# Patient Record
Sex: Female | Born: 1965 | Race: Black or African American | Hispanic: No | Marital: Single | Smoking: Never smoker
Health system: Southern US, Community
[De-identification: ages and names within clinical notes are randomized; demographics above are authoritative.]

## PROBLEM LIST (undated history)

## (undated) DIAGNOSIS — J40 Bronchitis, not specified as acute or chronic: Secondary | ICD-10-CM

## (undated) DIAGNOSIS — I10 Essential (primary) hypertension: Secondary | ICD-10-CM

---

## 2013-02-28 ENCOUNTER — Encounter (HOSPITAL_COMMUNITY): Payer: Self-pay | Admitting: *Deleted

## 2013-02-28 ENCOUNTER — Emergency Department (HOSPITAL_COMMUNITY)
Admission: EM | Admit: 2013-02-28 | Discharge: 2013-02-28 | Disposition: A | Discharge status: Home / Self Care | Payer: Self-pay | Attending: Emergency Medicine | Admitting: Emergency Medicine

## 2013-02-28 DIAGNOSIS — J029 Acute pharyngitis, unspecified: Secondary | ICD-10-CM | POA: Insufficient documentation

## 2013-02-28 DIAGNOSIS — R509 Fever, unspecified: Secondary | ICD-10-CM | POA: Insufficient documentation

## 2013-02-28 DIAGNOSIS — Z8709 Personal history of other diseases of the respiratory system: Secondary | ICD-10-CM | POA: Insufficient documentation

## 2013-02-28 DIAGNOSIS — R059 Cough, unspecified: Secondary | ICD-10-CM | POA: Insufficient documentation

## 2013-02-28 DIAGNOSIS — Z79899 Other long term (current) drug therapy: Secondary | ICD-10-CM | POA: Insufficient documentation

## 2013-02-28 DIAGNOSIS — R05 Cough: Secondary | ICD-10-CM | POA: Insufficient documentation

## 2013-02-28 HISTORY — DX: Bronchitis, not specified as acute or chronic: J40

## 2013-02-28 MED ORDER — HYDROCODONE-HOMATROPINE 5-1.5 MG/5ML PO SYRP: 5.0000 mL | ORAL_SOLUTION | Freq: Four times a day (QID) | ORAL | Status: DC | PRN

## 2013-02-28 MED ORDER — PREDNISONE 50 MG PO TABS
60.0000 mg | ORAL_TABLET | Freq: Once | ORAL | Status: AC
  Administered 2013-02-28: 60 mg via ORAL
  Filled 2013-02-28: qty 1

## 2013-02-28 MED ORDER — HYDROCOD POLST-CHLORPHEN POLST 10-8 MG/5ML PO LQCR
5.0000 mL | Freq: Once | ORAL | Status: AC
  Administered 2013-02-28: 5 mL via ORAL
  Filled 2013-02-28: qty 5

## 2013-02-28 NOTE — ED Notes (Signed)
Pt reports cough & sore throat. Thinks my have been running fever yesterday.

## 2013-02-28 NOTE — ED Provider Notes (Signed)
History     CSN: [id number]  Arrival date & time 02/28/13  4132   First MD Initiated Contact with Patient 02/28/13 [phone]      Chief Complaint  Patient presents with  . Sore Throat  . Cough    Patient is a 47 y.o. female presenting with pharyngitis and cough. The history is provided by the patient.  Sore Throat This is a new problem. The current episode started 2 days ago. The problem occurs daily. The problem has been gradually worsening. Pertinent negatives include no chest pain and no shortness of breath. The symptoms are aggravated by swallowing. Nothing relieves the symptoms. Treatments tried: OTC meds. The treatment provided no relief.  Cough Cough characteristics:  Productive Sputum characteristics:  Green Severity:  Mild Onset quality:  Gradual Duration:  2 days Timing:  Intermittent Progression:  Unchanged Smoker: no   Associated symptoms: chills, fever and sore throat   Associated symptoms: no chest pain and no shortness of breath     Past Medical History  Diagnosis Date  . Bronchitis     History reviewed. No pertinent past surgical history.  No family history on file.  History  Substance Use Topics  . Smoking status: Never Smoker   . Smokeless tobacco: Not on file  . Alcohol Use: Yes     Comment: occ    OB History   Grav Para Term Preterm Abortions TAB SAB Ect Mult Living                  Review of Systems  Constitutional: Positive for fever and chills.  HENT: Positive for sore throat.   Respiratory: Positive for cough. Negative for shortness of breath.   Cardiovascular: Negative for chest pain.    Allergies  Review of patient's allergies indicates no known allergies.  Home Medications   Current Outpatient Rx  Name  Route  Sig  Dispense  Refill  . albuterol (PROVENTIL HFA;VENTOLIN HFA) 108 (90 BASE) MCG/ACT inhaler   Inhalation   Inhale 2 puffs into the lungs as needed for wheezing.         Marland Kitchen ibuprofen (ADVIL,MOTRIN) 400 MG tablet  Oral   Take 600 mg by mouth every 6 (six) hours as needed for pain.         Marland Kitchen HYDROcodone-homatropine (HYCODAN) 5-1.5 MG/5ML syrup   Oral   Take 5 mLs by mouth every 6 (six) hours as needed for cough.   120 mL   0     BP 143/93  Pulse 105  Temp(Src) 99 F (37.2 C) (Oral)  Resp 20  Ht 5\' 6"  (1.676 m)  Wt 245 lb (111.131 kg)  BMI 39.56 kg/m2  SpO2 96%  LMP 02/14/2013  Physical Exam CONSTITUTIONAL: Well developed/well nourished HEAD: Normocephalic/atraumatic EYES: EOMI/PERRL ENMT: Mucous membranes moist, nasal congestion Uvula midline but pharyngeal erythema noted with scattered vesicles.  No stridor.  Normal phonation, she is handling secretions well.  No trismus noted.   NECK: supple no meningeal signs CV: S1/S2 noted, no murmurs/rubs/gallops noted LUNGS: Lungs are clear to auscultation bilaterally, no apparent distress ABDOMEN: soft, nontender, no rebound or guarding NEURO: Pt is awake/alert, moves all extremitiesx4 EXTREMITIES: pulses normal, full ROM SKIN: warm, color normal PSYCH: no abnormalities of mood noted  ED Course  Procedures  1. Cough   2. Pharyngitis     Pt well appearing, no distress, will try for symptomatic relief with one dose of prednisone and will give cough suppressant and she was warned of  side effects with cough suppressant Stable for d/c  MDM  Nursing notes including past medical history and social history reviewed and considered in documentation         Joya Gaskins, MD 02/28/13 [phone]

## 2013-02-28 NOTE — ED Notes (Signed)
Pt alert & oriented x4, stable gait. Patient given discharge instructions, paperwork & prescription(s). Patient  instructed to stop at the registration desk to finish any additional paperwork. Patient verbalized understanding. Pt left department w/ no further questions. 

## 2013-06-13 ENCOUNTER — Emergency Department: Payer: Self-pay | Admitting: Emergency Medicine

## 2013-06-13 LAB — BASIC METABOLIC PANEL
Calcium, Total: 8.9 mg/dL (ref 8.5–10.1)
Chloride: 104 mmol/L (ref 98–107)
Glucose: 106 mg/dL — ABNORMAL HIGH (ref 65–99)
Sodium: 137 mmol/L (ref 136–145)

## 2013-06-13 LAB — CBC
HGB: 14.3 g/dL (ref 12.0–16.0)
MCHC: 34 g/dL (ref 32.0–36.0)
RBC: 4.81 10*6/uL (ref 3.80–5.20)
WBC: 6.1 10*3/uL (ref 3.6–11.0)

## 2013-12-27 ENCOUNTER — Encounter (HOSPITAL_COMMUNITY): Payer: Self-pay | Admitting: Emergency Medicine

## 2013-12-27 ENCOUNTER — Emergency Department (HOSPITAL_COMMUNITY)
Admission: EM | Admit: 2013-12-27 | Discharge: 2013-12-27 | Disposition: A | Discharge status: Home / Self Care | Payer: Self-pay | Attending: Emergency Medicine | Admitting: Emergency Medicine

## 2013-12-27 DIAGNOSIS — Z8709 Personal history of other diseases of the respiratory system: Secondary | ICD-10-CM | POA: Insufficient documentation

## 2013-12-27 DIAGNOSIS — N390 Urinary tract infection, site not specified: Secondary | ICD-10-CM | POA: Insufficient documentation

## 2013-12-27 LAB — URINALYSIS, ROUTINE W REFLEX MICROSCOPIC
Bilirubin Urine: NEGATIVE
Glucose, UA: 250 mg/dL — AB
KETONES UR: NEGATIVE mg/dL
NITRITE: POSITIVE — AB
PROTEIN: 100 mg/dL — AB
Specific Gravity, Urine: 1.01 (ref 1.005–1.030)
UROBILINOGEN UA: 4 mg/dL — AB (ref 0.0–1.0)
pH: 5 (ref 5.0–8.0)

## 2013-12-27 LAB — URINE MICROSCOPIC-ADD ON

## 2013-12-27 MED ORDER — SULFAMETHOXAZOLE-TRIMETHOPRIM 800-160 MG PO TABS: 1.0000 | ORAL_TABLET | Freq: Two times a day (BID) | ORAL | Status: AC

## 2013-12-27 MED ORDER — PHENAZOPYRIDINE HCL 200 MG PO TABS: 200.0000 mg | ORAL_TABLET | Freq: Three times a day (TID) | ORAL | Status: DC

## 2013-12-27 NOTE — ED Provider Notes (Signed)
Medical screening examination/treatment/procedure(s) were performed by non-physician practitioner and as supervising physician I was immediately available for consultation/collaboration.   EKG Interpretation None     ] Cicley Ganesh, MD, FACEP   Furqan Gosselin L Orphia Mctigue, MD 12/27/13 1608 

## 2013-12-27 NOTE — ED Notes (Signed)
Hope NP in prior to RN, see NP assessment for further,  

## 2013-12-27 NOTE — ED Notes (Signed)
uti sx x 3 days with burning, frequency, and lower abd pain.  Denies n/v/fever.

## 2013-12-27 NOTE — ED Provider Notes (Signed)
CSN: [id number]     Arrival date & time 12/27/13  [id number] History   First MD Initiated Contact with Patient 12/27/13 [phone]     Chief Complaint  Patient presents with  . Urinary Tract Infection     (Consider location/radiation/quality/duration/timing/severity/associated sxs/prior Treatment) Patient is a 48 y.o. female presenting with urinary tract infection.  Urinary Tract Infection This is a new problem. The current episode started in the past 7 days. The problem occurs constantly. The problem has been gradually worsening. Nothing aggravates the symptoms. She has tried drinking for the symptoms. The treatment provided no relief.   Julie Vaughan is a 48 y.o. female who presents to the ED with UTI symptoms. She states that 4 days ago she began having urinary urgency and pain with urination. She has continued to have the symptoms and they seem to be getting worse. She denies fever or chills, nausea or vomiting, vaginal discharge or bleeding. She is not concerned about STI's.   Past Medical History  Diagnosis Date  . Bronchitis    History reviewed. No pertinent past surgical history. No family history on file. History  Substance Use Topics  . Smoking status: Never Smoker   . Smokeless tobacco: Not on file  . Alcohol Use: Yes     Comment: occ   OB History   Grav Para Term Preterm Abortions TAB SAB Ect Mult Living                 Review of Systems Negative except as stated in HPI   Allergies  Review of patient's allergies indicates no known allergies.  Home Medications   Current Outpatient Rx  Name  Route  Sig  Dispense  Refill  . albuterol (PROVENTIL HFA;VENTOLIN HFA) 108 (90 BASE) MCG/ACT inhaler   Inhalation   Inhale 2 puffs into the lungs as needed for wheezing.         Marland Kitchen. HYDROcodone-homatropine (HYCODAN) 5-1.5 MG/5ML syrup   Oral   Take 5 mLs by mouth every 6 (six) hours as needed for cough.   120 mL   0   . ibuprofen (ADVIL,MOTRIN) 400 MG tablet   Oral   Take 600  mg by mouth every 6 (six) hours as needed for pain.          BP 154/71  Pulse 82  Temp(Src) 98.5 F (36.9 C) (Oral)  Resp 18  SpO2 100%  LMP 11/28/2013 Physical Exam  Nursing note and vitals reviewed. Constitutional: She is oriented to person, place, and time. She appears well-developed and well-nourished.  HENT:  Head: Normocephalic.  Eyes: EOM are normal.  Neck: Neck supple.  Cardiovascular: Normal rate and regular rhythm.   Pulmonary/Chest: Effort normal and breath sounds normal.  Abdominal: Soft. Bowel sounds are normal. There is tenderness in the suprapubic area. There is no CVA tenderness.  Musculoskeletal: Normal range of motion.  Neurological: She is alert and oriented to person, place, and time. No cranial nerve deficit.  Skin: Skin is warm and dry.  Psychiatric: She has a normal mood and affect. Her behavior is normal.    ED Course  Procedures Results for orders placed during the hospital encounter of 12/27/13 (from the past 24 hour(s))  URINALYSIS, ROUTINE W REFLEX MICROSCOPIC     Status: Abnormal   Collection Time    12/27/13  9:08 AM      Result Value Ref Range   Color, Urine ORANGE (*) YELLOW   APPearance CLEAR  CLEAR   Specific Gravity, Urine  1.010  1.005 - 1.030   pH 5.0  5.0 - 8.0   Glucose, UA 250 (*) NEGATIVE mg/dL   Hgb urine dipstick TRACE (*) NEGATIVE   Bilirubin Urine NEGATIVE  NEGATIVE   Ketones, ur NEGATIVE  NEGATIVE mg/dL   Protein, ur 161 (*) NEGATIVE mg/dL   Urobilinogen, UA 4.0 (*) 0.0 - 1.0 mg/dL   Nitrite POSITIVE (*) NEGATIVE   Leukocytes, UA TRACE (*) NEGATIVE  URINE MICROSCOPIC-ADD ON     Status: Abnormal   Collection Time    12/27/13  9:08 AM      Result Value Ref Range   Squamous Epithelial / LPF FEW (*) RARE   WBC, UA 0-2  <3 WBC/hpf   RBC / HPF 0-2  <3 RBC/hpf   Bacteria, UA RARE  RARE    MDM  48 y.o. female who presents to the ED with pain on urination. Will treat UTI and she will follow up with her PCP or return here as  needed. Stable for discharge without fever or signs of pyelo. Discussed with the patient and all questioned fully answered. She will call me if any problems arise.    Medication List    TAKE these medications       phenazopyridine 200 MG tablet  Commonly known as:  PYRIDIUM  Take 1 tablet (200 mg total) by mouth 3 (three) times daily.     sulfamethoxazole-trimethoprim 800-160 MG per tablet  Commonly known as:  BACTRIM DS,SEPTRA DS  Take 1 tablet by mouth 2 (two) times daily.      ASK your doctor about these medications       albuterol 108 (90 BASE) MCG/ACT inhaler  Commonly known as:  PROVENTIL HFA;VENTOLIN HFA  Inhale 2 puffs into the lungs as needed for wheezing.     HYDROcodone-homatropine 5-1.5 MG/5ML syrup  Commonly known as:  HYCODAN  Take 5 mLs by mouth every 6 (six) hours as needed for cough.     ibuprofen 400 MG tablet  Commonly known as:  ADVIL,MOTRIN  Take 600 mg by mouth every 6 (six) hours as needed for pain.            [facility] Orlene Och, [place] 12/27/13 1450

## 2014-07-17 ENCOUNTER — Emergency Department: Payer: Self-pay | Admitting: Student

## 2015-04-22 ENCOUNTER — Emergency Department (HOSPITAL_COMMUNITY)
Admission: EM | Admit: 2015-04-22 | Discharge: 2015-04-22 | Disposition: A | Discharge status: Home / Self Care | Payer: Self-pay | Attending: Emergency Medicine | Admitting: Emergency Medicine

## 2015-04-22 ENCOUNTER — Encounter (HOSPITAL_COMMUNITY): Payer: Self-pay | Admitting: Emergency Medicine

## 2015-04-22 DIAGNOSIS — S39012A Strain of muscle, fascia and tendon of lower back, initial encounter: Secondary | ICD-10-CM

## 2015-04-22 DIAGNOSIS — Y929 Unspecified place or not applicable: Secondary | ICD-10-CM | POA: Insufficient documentation

## 2015-04-22 DIAGNOSIS — Y999 Unspecified external cause status: Secondary | ICD-10-CM | POA: Insufficient documentation

## 2015-04-22 DIAGNOSIS — Z79899 Other long term (current) drug therapy: Secondary | ICD-10-CM | POA: Insufficient documentation

## 2015-04-22 DIAGNOSIS — Z8709 Personal history of other diseases of the respiratory system: Secondary | ICD-10-CM | POA: Insufficient documentation

## 2015-04-22 DIAGNOSIS — Y939 Activity, unspecified: Secondary | ICD-10-CM | POA: Insufficient documentation

## 2015-04-22 DIAGNOSIS — X58XXXA Exposure to other specified factors, initial encounter: Secondary | ICD-10-CM | POA: Insufficient documentation

## 2015-04-22 MED ORDER — DICLOFENAC SODIUM 75 MG PO TBEC: 75.0000 mg | DELAYED_RELEASE_TABLET | Freq: Two times a day (BID) | ORAL | Status: DC

## 2015-04-22 MED ORDER — METHOCARBAMOL 500 MG PO TABS: 500.0000 mg | ORAL_TABLET | Freq: Three times a day (TID) | ORAL | Status: DC

## 2015-04-22 NOTE — Discharge Instructions (Signed)

## 2015-04-22 NOTE — ED Provider Notes (Signed)
History  This chart was scribed for non-physician practitioner, Ivery Quale, PA-C,working with Gerhard Munch, MD, by Karle Plumber, ED Scribe. This patient was seen in room APFT21/APFT21 and the patient's care was started at 1:51 PM.  Chief Complaint  Patient presents with  . Back Pain   The history is provided by the patient and medical records. No language interpreter was used.    HPI Comments:  Julie Vaughan is a 49 y.o. obese female who presents to the Emergency Department complaining of severe lower back pain that started approximately one week ago. Pt reports the pain radiates to her left-side. She states she believed she had a UTI secondary to increased urination. 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 She has not taken anything for her pain. She denies numbness, tingling or weakness of the lower extremities, fever, chills, nausea, vomiting, bowel or bladder incontinence.  Past Medical History  Diagnosis Date  . Bronchitis    History reviewed. No pertinent past surgical history. No family history on file. History  Substance Use Topics  . Smoking status: Never Smoker   . Smokeless tobacco: Not on file  . Alcohol Use: Yes     Comment: occ   OB History    No data available     Review of Systems  Constitutional: Negative for fever and chills.  Gastrointestinal: Negative for nausea and vomiting.  Genitourinary: Positive for frequency.  Musculoskeletal: Positive for back pain.  Neurological: Negative for weakness and numbness.  All other systems reviewed and are negative.   Allergies  Review of patient's allergies indicates no known allergies.  Home Medications   Prior to Admission medications   Medication Sig Start Date End Date Taking? Authorizing Provider  albuterol (PROVENTIL HFA;VENTOLIN HFA) 108 (90 BASE) MCG/ACT inhaler Inhale 2 puffs into the lungs as needed for wheezing.    Historical Provider, MD   HYDROcodone-homatropine (HYCODAN) 5-1.5 MG/5ML syrup Take 5 mLs by mouth every 6 (six) hours as needed for cough. 02/28/13   Zadie Rhine, MD  ibuprofen (ADVIL,MOTRIN) 400 MG tablet Take 600 mg by mouth every 6 (six) hours as needed for pain.    Historical Provider, MD  phenazopyridine (PYRIDIUM) 200 MG tablet Take 1 tablet (200 mg total) by mouth 3 (three) times daily. 12/27/13   Hope Orlene Och, NP   Triage Vitals: BP 145/104 mmHg  Pulse 98  Temp(Src) 98.2 F (36.8 C) (Oral)  Resp 16  Ht 5\' 6"  (1.676 m)  Wt 235 lb (106.595 kg)  BMI 37.95 kg/m2  SpO2 100%  LMP 04/22/2015 Physical Exam  Constitutional: She is oriented to person, place, and time. She appears well-developed and well-nourished.  HENT:  Head: Normocephalic and atraumatic.  Eyes: EOM are normal.  Neck: Normal range of motion.  Cardiovascular: Normal rate, regular rhythm and normal heart sounds.   No murmur heard. Pulmonary/Chest: Effort normal and breath sounds normal. No respiratory distress.  Musculoskeletal: Normal range of motion.  Paraspinal area of tenderness to lumbar region with ROM on right and left.  Neurological: She is alert and oriented to person, place, and time. She exhibits normal muscle tone. Coordination and gait normal.  No gross neurologic deficits. No sensory deficits. Gait steady, no foot drop  Skin: Skin is warm and dry.  Psychiatric: She has a normal mood and affect. Her behavior is normal.  Nursing note and vitals reviewed.   ED Course  Procedures (including critical care time) DIAGNOSTIC STUDIES: Oxygen Saturation is 100% on  RA, normal by my interpretation.   COORDINATION OF CARE: 1:57 PM- Will prescribe Robaxin. Return precautions discussed. Offered work note to rest her back but pt declined. Pt verbalizes understanding and agrees to plan.  Medications - No data to display  Labs Review Labs Reviewed - No data to display  Imaging Review No results found.   EKG Interpretation None       MDM  Patient presents with one week of lower back pain on the right and on the left. His been no high fever reported. No dysuria reported. No report of blunt trauma. The patient does do repetitive lifting, and she works in a infant day care.  The examination suggests muscle strain in the lumbar region. The patient will be treated with Robaxin and diclofenac. Also asked patient to use heating pad to the area and rest her back is much as possible. The patient acknowledges this discharge instruction. She will return if any changes, problems, or concerns.    Final diagnoses:  None    **I have reviewed nursing notes, vital signs, and all appropriate lab and imaging results for this patient.*  I personally performed the services described in this documentation, which was scribed in my presence. The recorded information has been reviewed and is accurate.    Ivery Quale, PA-C 04/22/15 1418  Gerhard Munch, MD 04/22/15 [phone]

## 2015-04-22 NOTE — ED Notes (Signed)
Pt reports intermittent back x 1 weeks. Pt reports back pain in that past, pt denies any recent injuries.

## 2017-06-24 ENCOUNTER — Emergency Department
Admission: EM | Admit: 2017-06-24 | Discharge: 2017-06-24 | Disposition: A | Discharge status: Home / Self Care | Payer: 59 | Attending: Emergency Medicine | Admitting: Emergency Medicine

## 2017-06-24 ENCOUNTER — Encounter: Payer: Self-pay | Admitting: Emergency Medicine

## 2017-06-24 DIAGNOSIS — R05 Cough: Secondary | ICD-10-CM | POA: Diagnosis present

## 2017-06-24 DIAGNOSIS — Z8709 Personal history of other diseases of the respiratory system: Secondary | ICD-10-CM | POA: Insufficient documentation

## 2017-06-24 DIAGNOSIS — Z79899 Other long term (current) drug therapy: Secondary | ICD-10-CM | POA: Diagnosis not present

## 2017-06-24 DIAGNOSIS — J00 Acute nasopharyngitis [common cold]: Secondary | ICD-10-CM | POA: Insufficient documentation

## 2017-06-24 DIAGNOSIS — R059 Cough, unspecified: Secondary | ICD-10-CM

## 2017-06-24 MED ORDER — AZITHROMYCIN 250 MG PO TABS: ORAL_TABLET | ORAL | 0 refills | Status: AC

## 2017-06-24 MED ORDER — GUAIFENESIN-CODEINE 100-10 MG/5ML PO SOLN: 5.0000 mL | Freq: Three times a day (TID) | ORAL | 0 refills | Status: DC | PRN

## 2017-06-24 NOTE — ED Notes (Signed)
Pt started Sunday with nasal congestion and post nasal drip - Pt reports that she started with a cough yesterday - pt took her bosses inhaler today at work and it made her "breath better" - she reports that 2-3 years ago she was given albuterol inhaler for a similar issue - cough became productive (thick green mucus) this morning

## 2017-06-24 NOTE — ED Triage Notes (Signed)
Pt reports cough that began yesterday. Pt reports coughing up green phlegm. Pt denies any other symptoms. Pt speaking in complete sentences without difficulty. 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

## 2017-06-24 NOTE — ED Provider Notes (Signed)
[facility] Emergency Department Provider Note   ____________________________________________   I have reviewed the triage vital signs and the nursing notes.   HISTORY  Chief Complaint Cough    HPI Julie Vaughan is a 51 y.o. female presents to the emergency department with a productive cough, nasal congestion, sinus pressure, postnasal drip that developed over the last 2-3 days. Patient noted the cough significantly worsening yesterday and interfering with sleeping through the night. Patient reports getting a cold every year mostly occurring during the winter. Patient denies fever, chills, headache, vision changes, chest pain, chest tightness, shortness of breath, abdominal pain, nausea and vomiting.  Past Medical History:  Diagnosis Date  . Bronchitis     There are no active problems to display for this patient.   No past surgical history on file.  Prior to Admission medications   Medication Sig Start Date End Date Taking? Authorizing Provider  albuterol (PROVENTIL HFA;VENTOLIN HFA) 108 (90 BASE) MCG/ACT inhaler Inhale 2 puffs into the lungs as needed for wheezing.    [provider]  azithromycin (ZITHROMAX Z-PAK) 250 MG tablet Take 2 tablets (500 mg) on  Day 1,  followed by 1 tablet (250 mg) once daily on Days 2 through 5. 06/24/17 06/29/17  Mable Lashley M, PA-C  diclofenac (VOLTAREN) 75 MG EC tablet Take 1 tablet (75 mg total) by mouth 2 (two) times daily. 04/22/15   Ivery Quale, PA-C  guaiFENesin-codeine 100-10 MG/5ML syrup Take 5 mLs by mouth 3 (three) times daily as needed for cough. 06/24/17   Christain Niznik M, PA-C  HYDROcodone-homatropine (HYCODAN) 5-1.5 MG/5ML syrup Take 5 mLs by mouth every 6 (six) hours as needed for cough. 02/28/13   Zadie Rhine, MD  ibuprofen (ADVIL,MOTRIN) 400 MG tablet Take 600 mg by mouth every 6 (six) hours as needed for pain.    [provider]  methocarbamol (ROBAXIN) 500 MG tablet Take 1 tablet  (500 mg total) by mouth 3 (three) times daily. 04/22/15   Ivery Quale, PA-C  phenazopyridine (PYRIDIUM) 200 MG tablet Take 1 tablet (200 mg total) by mouth 3 (three) times daily. 12/27/13   Janne Napoleon, NP    Allergies Patient has no known allergies.  No family history on file.  Social History Social History  Substance Use Topics  . Smoking status: Never Smoker  . Smokeless tobacco: Not on file  . Alcohol use Yes     Comment: occ    Review of Systems Constitutional: Negative for fever/chills Eyes: No visual changes. ENT:  Mild sore throat without difficulty swallowing. Nasal congestion with post-nasal drip. Sinus pressure.  Cardiovascular: Denies chest pain. Respiratory: Productive cough.  Gastrointestinal: No abdominal pain.  No nausea, vomiting, diarrhea. Skin: Negative for rash. Neurological: positivefor headaches.   ____________________________________________   PHYSICAL EXAM:  VITAL SIGNS: ED Triage Vitals  Enc Vitals Group     BP 06/24/17 1600 (!) 156/94     Pulse Rate 06/24/17 1600 85     Resp 06/24/17 1600 18     Temp 06/24/17 1600 98.4 F (36.9 C)     Temp Source 06/24/17 1600 Oral     SpO2 06/24/17 1600 100 %     Weight 06/24/17 1601 230 lb (104.3 kg)     Height 06/24/17 1601  (1.676 m)     Head Circumference --      Peak Flow --      Pain Score --      Pain Loc --  Pain Edu? --      Excl. in GC? --     Constitutional: Alert and oriented. Well appearing and in no acute distress.  Eyes: Conjunctivae are normal. PERRL. EOMI  Head: Normocephalic and atraumatic. ENT: Ears:Canals clear. TMs intact bilaterally. Nose: Congestion/rhinnorhea. Mouth/Throat: Mucous membranes are moist. Oropharynx with mild erythema. Tonsils symmetrical bilaterally. Neck:Supple. No thyromegaly. No stridor. Cardiovascular: Normal rate, regular rhythm. Good peripheral circulation. Respiratory: Productive cough with yellow and green sputum noted  during coughing episodes. Normal respiratory effort without tachypnea or retractions. Lungs CTAB. Good air entry to the bases with no decreased or absent breath sounds. Cardiovascular: Normal rate, regular rhythm. Normal distal pulses. Neurologic: Normal speech and language.  Skin:  Skin is warm, dry and intact. No rash noted. Psychiatric:Mood and affect are normal. Speech and behavior are normal. Patient exhibits appropriate insight and judgement. ____________________________________________   LABS (all labs ordered are listed, but only abnormal results are displayed)  Labs Reviewed - No data to display ____________________________________________  EKG none ____________________________________________  RADIOLOGY none ____________________________________________   PROCEDURES  Procedure(s) performed: no    Critical Care performed: no ____________________________________________   INITIAL IMPRESSION / ASSESSMENT AND PLAN / ED COURSE  Pertinent labs & imaging results that were available during my care of the patient were reviewed by me and considered in my medical decision making (see chart for details).  Patient presents to the emergency department productive cough, congestion, rhinorrhea, post-nasal drip, and fatigue. History and physical exam are reassuring symptoms are consistent with nasopharyngitis with cough. Patient will be prescribed azithromycin for antibiotic coverage and guaifenesin with codeine. Recommend patient to utilize OTC cold and sinus for symptom management as needed. Physical exam and vital signes were reassuring at this time. Patient informed of clinical course, understand medical decision-making process, and agree with plan. Patient was advised to follow up with PCP as needed and was also advised to return to the emergency department for symptoms that change or worsen.  ____________________________________________   FINAL CLINICAL IMPRESSION(S) / ED  DIAGNOSES  Final diagnoses:  Acute nasopharyngitis  Cough       NEW MEDICATIONS STARTED DURING THIS VISIT:  Discharge Medication List as of 06/24/2017  4:49 PM    START taking these medications   Details  azithromycin (ZITHROMAX Z-PAK) 250 MG tablet Take 2 tablets (500 mg) on  Day 1,  followed by 1 tablet (250 mg) once daily on Days 2 through 5., Print    guaiFENesin-codeine 100-10 MG/5ML syrup Take 5 mLs by mouth 3 (three) times daily as needed for cough., Starting Wed 06/24/2017, Print         Note:  This document was prepared using Dragon voice recognition software and may include unintentional dictation errors.    Clois Comber, PA-C 06/24/17 1722    Emily Filbert, MD 06/25/17 [phone]

## 2017-06-24 NOTE — Discharge Instructions (Signed)
Take medication as prescribed. Return to emergency department if symptoms worsen and follow-up with PCP as needed.   °

## 2017-08-24 ENCOUNTER — Emergency Department
Admission: EM | Admit: 2017-08-24 | Discharge: 2017-08-24 | Disposition: A | Discharge status: Home / Self Care | Payer: 59 | Attending: Emergency Medicine | Admitting: Emergency Medicine

## 2017-08-24 ENCOUNTER — Other Ambulatory Visit: Payer: Self-pay

## 2017-08-24 ENCOUNTER — Encounter: Payer: Self-pay | Admitting: Emergency Medicine

## 2017-08-24 DIAGNOSIS — S39012A Strain of muscle, fascia and tendon of lower back, initial encounter: Secondary | ICD-10-CM | POA: Insufficient documentation

## 2017-08-24 DIAGNOSIS — Y9389 Activity, other specified: Secondary | ICD-10-CM | POA: Insufficient documentation

## 2017-08-24 DIAGNOSIS — X500XXA Overexertion from strenuous movement or load, initial encounter: Secondary | ICD-10-CM | POA: Insufficient documentation

## 2017-08-24 DIAGNOSIS — Y92003 Bedroom of unspecified non-institutional (private) residence as the place of occurrence of the external cause: Secondary | ICD-10-CM | POA: Insufficient documentation

## 2017-08-24 DIAGNOSIS — Z79899 Other long term (current) drug therapy: Secondary | ICD-10-CM | POA: Insufficient documentation

## 2017-08-24 DIAGNOSIS — Y999 Unspecified external cause status: Secondary | ICD-10-CM | POA: Diagnosis not present

## 2017-08-24 DIAGNOSIS — S3992XA Unspecified injury of lower back, initial encounter: Secondary | ICD-10-CM | POA: Diagnosis present

## 2017-08-24 LAB — URINALYSIS, COMPLETE (UACMP) WITH MICROSCOPIC
BACTERIA UA: NONE SEEN
BILIRUBIN URINE: NEGATIVE
Glucose, UA: NEGATIVE mg/dL
KETONES UR: NEGATIVE mg/dL
LEUKOCYTES UA: NEGATIVE
Nitrite: NEGATIVE
Protein, ur: NEGATIVE mg/dL
SPECIFIC GRAVITY, URINE: 1.025 (ref 1.005–1.030)
pH: 5 (ref 5.0–8.0)

## 2017-08-24 MED ORDER — MELOXICAM 15 MG PO TABS: 15.0000 mg | ORAL_TABLET | Freq: Every day | ORAL | 2 refills | Status: AC

## 2017-08-24 MED ORDER — BACLOFEN 10 MG PO TABS: 10.0000 mg | ORAL_TABLET | Freq: Every day | ORAL | 1 refills | Status: AC

## 2017-08-24 NOTE — ED Notes (Signed)
Left flank pain that increases with certain movmetn feels like a catch.  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

## 2017-08-24 NOTE — ED Provider Notes (Signed)
[facility] Emergency Department Provider Note  ____________________________________________   First MD Initiated Contact with Patient 08/24/17 1209     (approximate)  I have reviewed the triage vital signs and the nursing notes.   HISTORY  Chief Complaint Back Pain    HPI Julie Vaughan is a 51 y.o. female lanes of low back pain, states she was carrying a tote to her bedroom and twisted, states she felt a catch in her back, denies numbness or tingling, states it felt a little better last night but was worse this morning, states she is worried about a UTI because she has urinary frequency, denies fever or chills, denies vaginal discharge   Past Medical History:  Diagnosis Date  . Bronchitis     There are no active problems to display for this patient.   History reviewed. No pertinent surgical history.  Prior to Admission medications   Medication Sig Start Date End Date Taking? Authorizing Provider  albuterol (PROVENTIL HFA;VENTOLIN HFA) 108 (90 BASE) MCG/ACT inhaler Inhale 2 puffs into the lungs as needed for wheezing.    [provider]  baclofen (LIORESAL) 10 MG tablet Take 1 tablet (10 mg total) by mouth daily. 08/24/17 08/24/18  Jenni Thew, Roselyn BeringSusan W, PA-C  meloxicam (MOBIC) 15 MG tablet Take 1 tablet (15 mg total) by mouth daily. 08/24/17 08/24/18  Faythe GheeFisher, Carren Blakley W, PA-C    Allergies Patient has no known allergies.  History reviewed. No pertinent family history.  Social History Social History   Tobacco Use  . Smoking status: Never Smoker  . Smokeless tobacco: Never Used  Substance Use Topics  . Alcohol use: Yes    Comment: occ  . Drug use: No    Review of Systems  Constitutional: No fever/chills Eyes: No visual changes. ENT: No sore throat. Respiratory: Denies cough Genitourinary: Neg for dysuria. Positive for frequency Musculoskeletal: Positive for back pain. Skin: Negative for  rash.    ____________________________________________   PHYSICAL EXAM:  VITAL SIGNS: ED Triage Vitals [08/24/17 1134]  Enc Vitals Group     BP (!) 152/94     Pulse Rate 80     Resp 18     Temp 98.7 F (37.1 C)     Temp Source Oral     SpO2 100 %     Weight 230 lb (104.3 kg)     Height      Head Circumference      Peak Flow      Pain Score 10     Pain Loc      Pain Edu?      Excl. in GC?     Constitutional: Alert and oriented. Well appearing and in no acute distress. Eyes: Conjunctivae are normal.  Head: Atraumatic. Nose: No congestion/rhinnorhea. Mouth/Throat: Mucous membranes are moist.   Cardiovascular: Normal rate, regular rhythm. Respiratory: Normal respiratory effort.  No retractions GU: deferred Musculoskeletal: FROM all extremities, warm and well perfused, lumbar spine is tender in the paravertebral muscles and at the right side SI joint, patient is able to ambulate without difficulty,  negative straight leg raise Neurologic:  Normal speech and language.  Skin:  Skin is warm, dry and intact. No rash noted. Psychiatric: Mood and affect are normal. Speech and behavior are normal.  ____________________________________________   LABS (all labs ordered are listed, but only abnormal results are displayed)  Labs Reviewed  URINALYSIS, COMPLETE (UACMP) WITH MICROSCOPIC - Abnormal; Notable for the following components:      Result Value  Color, Urine YELLOW (*)    APPearance CLEAR (*)    Hgb urine dipstick MODERATE (*)    Squamous Epithelial / LPF 0-5 (*)    All other components within normal limits   ____________________________________________   ____________________________________________  RADIOLOGY    ____________________________________________   PROCEDURES  Procedure(s) performed: No      ____________________________________________   INITIAL IMPRESSION / ASSESSMENT AND PLAN / ED COURSE  Pertinent labs & imaging results that were  available during my care of the patient were reviewed by me and considered in my medical decision making (see chart for details).  Patient is a 51 year old female, she is complaining of low back pain and questions if it's UTI, urinalysis is negative, diagnosis is lumbar strain, prescription for meloxicam 15 mg daily and baclofen 10 mg 3 times a day was given, patient was instructed on how to do stretches and core strengthening exercises, she is to use wet heat followed by ice, she is follow-up with her regular doctor or the acute care if not better in 5-7 days, she was cautioned on the use of the muscle relaxer while driving      ____________________________________________   FINAL CLINICAL IMPRESSION(S) / ED DIAGNOSES  Final diagnoses:  Strain of lumbar region, initial encounter      NEW MEDICATIONS STARTED DURING THIS VISIT:  This SmartLink is deprecated. Use AVSMEDLIST instead to display the medication list for a patient.   Note:  This document was prepared using Dragon voice recognition software and may include unintentional dictation errors.    Faythe GheeFisher, Tyreanna Bisesi W, PA-C 08/24/17 1312    Jene EveryKinner, Robert, MD 08/24/17 [phone]

## 2017-08-24 NOTE — ED Triage Notes (Signed)
Pt to ed with c/o frequency of urine and left sided back pain started last night and today.  Reports it feels like UTI.  She reports she has had several in the past.  Denies burning with urination. Denies recent injury.

## 2017-08-24 NOTE — Discharge Instructions (Signed)
Follow-up with the regular doctor if not better in 5-7 days, use medication as prescribed, use wet heat followed by ice, use the stretches that were described for you, if you're worsening return to the emergency department, your urinalysis was negative

## 2018-02-18 ENCOUNTER — Emergency Department: Payer: 59

## 2018-02-18 ENCOUNTER — Other Ambulatory Visit: Payer: Self-pay

## 2018-02-18 ENCOUNTER — Emergency Department
Admission: EM | Admit: 2018-02-18 | Discharge: 2018-02-18 | Disposition: A | Discharge status: Home / Self Care | Payer: 59 | Attending: Emergency Medicine | Admitting: Emergency Medicine

## 2018-02-18 DIAGNOSIS — Z79899 Other long term (current) drug therapy: Secondary | ICD-10-CM | POA: Diagnosis not present

## 2018-02-18 DIAGNOSIS — M25571 Pain in right ankle and joints of right foot: Secondary | ICD-10-CM

## 2018-02-18 MED ORDER — NAPROXEN 500 MG PO TABS
500.0000 mg | ORAL_TABLET | Freq: Once | ORAL | Status: AC
  Administered 2018-02-18: 500 mg via ORAL
  Filled 2018-02-18: qty 1

## 2018-02-18 MED ORDER — NAPROXEN 500 MG PO TABS: 500.0000 mg | ORAL_TABLET | Freq: Two times a day (BID) | ORAL | Status: DC

## 2018-02-18 NOTE — ED Provider Notes (Signed)
[facility] Emergency Department Provider Note   ____________________________________________   First MD Initiated Contact with Patient 02/18/18 0957     (approximate)  I have reviewed the triage vital signs and the nursing notes.   HISTORY  Chief Complaint Ankle Pain    HPI Julie Vaughan is a 52 y.o. female patient complain of right ankle pain for 2 days.  Patient had a previous injury back in March 2019 which improved with elastic support.  Patient state pain increased today with no other provocative incident.  Patient rates pain as a 7/10.  Patient described the pain is "throbbing".  No palliative measures prior to arrival.   Past Medical History:  Diagnosis Date  . Bronchitis     There are no active problems to display for this patient.   History reviewed. No pertinent surgical history.  Prior to Admission medications   Medication Sig Start Date End Date Taking? Authorizing Provider  albuterol (PROVENTIL HFA;VENTOLIN HFA) 108 (90 BASE) MCG/ACT inhaler Inhale 2 puffs into the lungs as needed for wheezing.    [provider]  baclofen (LIORESAL) 10 MG tablet Take 1 tablet (10 mg total) by mouth daily. 08/24/17 08/24/18  Fisher, Roselyn Bering, PA-C  meloxicam (MOBIC) 15 MG tablet Take 1 tablet (15 mg total) by mouth daily. 08/24/17 08/24/18  Sherrie Mustache Roselyn Bering, PA-C  naproxen (NAPROSYN) 500 MG tablet Take 1 tablet (500 mg total) by mouth 2 (two) times daily with a meal. 02/18/18   Joni Reining, PA-C    Allergies Patient has no known allergies.  No family history on file.  Social History Social History   Tobacco Use  . Smoking status: Never Smoker  . Smokeless tobacco: Never Used  Substance Use Topics  . Alcohol use: Yes    Comment: occ  . Drug use: No    Review of Systems Constitutional: No fever/chills Eyes: No visual changes. ENT: No sore throat. Cardiovascular: Denies chest pain. Respiratory: Denies shortness of  breath. Gastrointestinal: No abdominal pain.  No nausea, no vomiting.  No diarrhea.  No constipation. Genitourinary: Negative for dysuria. Musculoskeletal: Right ankle pain.. Skin: Negative for rash. Neurological: Negative for headaches, focal weakness or numbness.   ____________________________________________   PHYSICAL EXAM:  VITAL SIGNS: ED Triage Vitals  Enc Vitals Group     BP 02/18/18 0924 136/79     Pulse Rate 02/18/18 0924 81     Resp 02/18/18 0924 16     Temp 02/18/18 0924 (!) 97.4 F (36.3 C)     Temp Source 02/18/18 0924 Oral     SpO2 02/18/18 0924 100 %     Weight 02/18/18 0925 220 lb (99.8 kg)     Height 02/18/18 0925  (1.676 m)     Head Circumference --      Peak Flow --      Pain Score --      Pain Loc --      Pain Edu? --      Excl. in GC? --    Constitutional: Alert and oriented. Well appearing and in no acute distress. Cardiovascular: Normal rate, regular rhythm. Grossly normal heart sounds.  Good peripheral circulation. Respiratory: Normal respiratory effort.  No retractions. Lungs CTAB. Musculoskeletal: No obvious deformity to the right ankle.  There is mild edema to the medial aspect of the malleolus. Neurologic:  Normal speech and language. No gross focal neurologic deficits are appreciated. No gait instability. Skin:  Skin is warm, dry and intact.  No rash noted. Psychiatric: Mood and affect are normal. Speech and behavior are normal.  ____________________________________________   LABS (all labs ordered are listed, but only abnormal results are displayed)  Labs Reviewed - No data to display ____________________________________________  EKG   ____________________________________________  RADIOLOGY  ED MD interpretation:    Official radiology report(s): Dg Ankle Complete Right  Result Date: 02/18/2018 CLINICAL DATA:  Swelling, pain of right ankle.  Remote injury. EXAM: RIGHT ANKLE - COMPLETE 3+ VIEW COMPARISON:  None. FINDINGS:  Mild diffuse soft tissue swelling. Well corticated bone fragment adjacent to the tip of the medial malleolus. This likely related to old injury. No acute fracture, subluxation or dislocation. IMPRESSION: No acute bony abnormality. Electronically Signed   By: Charlett Nose M.D.   On: 02/18/2018 09:49    ____________________________________________   PROCEDURES  Procedure(s) performed: None  Procedures  Critical Care performed: No  ____________________________________________   INITIAL IMPRESSION / ASSESSMENT AND PLAN / ED COURSE  As part of my medical decision making, I reviewed the following data within the electronic MEDICAL RECORD NUMBER    Right ankle pain secondary to previous injury of the medial malleolus.  Discussed x-ray findings with patient.  Patient given discharge care instruction.  Patient placed in a ankle splint and postop shoe.  Patient advised follow-up with orthopedic for definitive evaluation and treatment.      ____________________________________________   FINAL CLINICAL IMPRESSION(S) / ED DIAGNOSES  Final diagnoses:  Acute right ankle pain     ED Discharge Orders        Ordered    naproxen (NAPROSYN) 500 MG tablet  2 times daily with meals     02/18/18 1007       Note:  This document was prepared using Dragon voice recognition software and may include unintentional dictation errors.    Joni Reining, PA-C 02/18/18 1014    Sharman Cheek, MD 02/18/18 [phone]

## 2018-02-18 NOTE — ED Triage Notes (Signed)
Pt reports that she is having pain in her right ankle x2 days - the pain started in March and she got ankle wrap and the pain improved until 2 days ago

## 2018-02-18 NOTE — ED Notes (Signed)
See triage note  Presents with swelling and pain to right ankle  States she had injury to same ankle in March  Developed this pain 2 days ago  Ambulates with slight limp to room

## 2018-02-18 NOTE — Discharge Instructions (Signed)
Wear ankle splint and open shoe until evaluation by orthopedic.  Call to schedule appointment.

## 2019-04-27 ENCOUNTER — Other Ambulatory Visit: Payer: Self-pay

## 2019-04-27 DIAGNOSIS — Z20822 Contact with and (suspected) exposure to covid-19: Secondary | ICD-10-CM

## 2019-04-28 LAB — NOVEL CORONAVIRUS, NAA: SARS-CoV-2, NAA: DETECTED — AB

## 2020-07-13 ENCOUNTER — Emergency Department (HOSPITAL_COMMUNITY)
Admission: EM | Admit: 2020-07-13 | Discharge: 2020-07-13 | Disposition: A | Discharge status: Home / Self Care | Payer: BC Managed Care – PPO | Attending: Emergency Medicine | Admitting: Emergency Medicine

## 2020-07-13 ENCOUNTER — Emergency Department (HOSPITAL_COMMUNITY): Payer: BC Managed Care – PPO

## 2020-07-13 ENCOUNTER — Other Ambulatory Visit: Payer: Self-pay

## 2020-07-13 ENCOUNTER — Encounter (HOSPITAL_COMMUNITY): Payer: Self-pay | Admitting: Emergency Medicine

## 2020-07-13 DIAGNOSIS — R519 Headache, unspecified: Secondary | ICD-10-CM | POA: Diagnosis not present

## 2020-07-13 DIAGNOSIS — Y9389 Activity, other specified: Secondary | ICD-10-CM | POA: Diagnosis not present

## 2020-07-13 DIAGNOSIS — M542 Cervicalgia: Secondary | ICD-10-CM | POA: Diagnosis not present

## 2020-07-13 DIAGNOSIS — Y92410 Unspecified street and highway as the place of occurrence of the external cause: Secondary | ICD-10-CM | POA: Insufficient documentation

## 2020-07-13 MED ORDER — NAPROXEN 250 MG PO TABS
500.0000 mg | ORAL_TABLET | Freq: Once | ORAL | Status: AC
  Administered 2020-07-13: 500 mg via ORAL
  Filled 2020-07-13: qty 2

## 2020-07-13 MED ORDER — METHOCARBAMOL 500 MG PO TABS: 500.0000 mg | ORAL_TABLET | Freq: Two times a day (BID) | ORAL | 0 refills | Status: DC
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
## 2020-07-13 NOTE — Discharge Instructions (Signed)
The pain you are experiencing is likely due to muscle strain, you may take Ibuprofen or Naprosyn and Robaxin as needed for pain management. Do not combine with any pain reliever other than tylenol.  You may also use ice and heat, and over-the-counter remedies such as Biofreeze gel or salon pas lidocaine patches. The muscle soreness should improve over the next week. Follow up with your family doctor in the next week for a recheck if you are still having symptoms. Return to ED if pain is worsening, you develop weakness or numbness of extremities, or new or concerning symptoms develop. ° °
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
## 2020-07-13 NOTE — ED Notes (Signed)
Pt reports she was seen last week at urgent care for HTN  Suggested that she follow w her PCP for HTN which she has yet to do

## 2021-12-17 ENCOUNTER — Emergency Department (HOSPITAL_COMMUNITY): Payer: 59

## 2021-12-17 ENCOUNTER — Other Ambulatory Visit: Payer: Self-pay

## 2021-12-17 ENCOUNTER — Emergency Department (HOSPITAL_COMMUNITY)
Admission: EM | Admit: 2021-12-17 | Discharge: 2021-12-17 | Disposition: A | Discharge status: Home / Self Care | Payer: 59 | Attending: Emergency Medicine | Admitting: Emergency Medicine

## 2021-12-17 ENCOUNTER — Encounter (HOSPITAL_COMMUNITY): Payer: Self-pay | Admitting: *Deleted

## 2021-12-17 DIAGNOSIS — G8929 Other chronic pain: Secondary | ICD-10-CM | POA: Diagnosis not present

## 2021-12-17 DIAGNOSIS — M25562 Pain in left knee: Secondary | ICD-10-CM | POA: Insufficient documentation

## 2021-12-17 NOTE — ED Triage Notes (Signed)
Left knee pain for couple of months off and on. Denies any injury.  ?

## 2021-12-17 NOTE — ED Provider Notes (Signed)
?[facility] EMERGENCY DEPARTMENT ?Provider Note ? ? ?CSN: [id number] ?Arrival date & time: 12/17/21  1248 ? ?  ? ?History ? ?Chief Complaint  ?Patient presents with  ? Knee Pain  ? ? ?Julie Vaughan is a 56 y.o. female. ? ?HPI ?Patient is a 56 year old female who presents to the emergency department due to atraumatic left knee pain.  States her symptoms started initially about 1 month ago and then began worsening 1 week ago.  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  States that her symptoms are typically worse at the end of the day.  Denies any numbness or weakness.  Denies any falls or trauma.  States that she soaked in Epsom salts this morning and took a 500 mg Tylenol with mild improvement in her symptoms. ?  ? ?Home Medications ?Prior to Admission medications   ?Medication Sig Start Date End Date Taking? Authorizing Provider  ?albuterol (PROVENTIL HFA;VENTOLIN HFA) 108 (90 BASE) MCG/ACT inhaler Inhale 2 puffs into the lungs as needed for wheezing.    [provider]  ?methocarbamol (ROBAXIN) 500 MG tablet Take 1 tablet (500 mg total) by mouth 2 (two) times daily. 07/13/20   Dartha Lodge, PA-C  ?naproxen (NAPROSYN) 500 MG tablet Take 1 tablet (500 mg total) by mouth 2 (two) times daily with a meal. 02/18/18   Joni Reining, PA-C  ?   ? ?Allergies    ?Patient has no known allergies.   ? ?Review of Systems   ?Review of Systems  ?Musculoskeletal:  Positive for arthralgias, joint swelling and myalgias.  ?Skin:  Negative for wound.  ?Neurological:  Negative for weakness and numbness.  ? ?Physical Exam ?Updated Vital Signs ?BP 139/86 (BP Location: Right Arm)   Pulse 98   Temp 97.7 ?F (36.5 ?C) (Oral)   Resp 18   Ht 5\' 6"  (1.676 m)   Wt 111.1 kg   SpO2 100%   BMI 39.54 kg/m?  ?Physical Exam ?Vitals and nursing note reviewed.  ?Constitutional:   ?   General: She is not in acute distress. ?   Appearance: She is well-developed.  ?HENT:   ?   Head: Normocephalic and atraumatic.  ?   Right Ear: External ear normal.  ?   Left Ear: External ear normal.  ?Eyes:  ?   General: No scleral icterus.    ?   Right eye: No discharge.     ?   Left eye: No discharge.  ?   Conjunctiva/sclera: Conjunctivae normal.  ?Neck:  ?   Trachea: No tracheal deviation.  ?Cardiovascular:  ?   Rate and Rhythm: Normal rate.  ?Pulmonary:  ?   Effort: Pulmonary effort is normal. No respiratory distress.  ?   Breath sounds: No stridor.  ?Abdominal:  ?   General: There is no distension.  ?Musculoskeletal:     ?   General: Tenderness present. No swelling or deformity.  ?   Cervical back: Neck supple.  ?   Comments: Mild TTP noted overlying the left patella as well as the medial and lateral joint lines.  Full active and passive range of motion of the joint with mild pain.  No overlying skin changes or soft tissue swelling noted.  Distal sensation intact.  2+ pedal pulses.  Strength is 5/5 in the bilateral lower extremities.  ?Skin: ?   General: Skin is warm and dry.  ?   Findings: No  rash.  ?Neurological:  ?   General: No focal deficit present.  ?   Mental Status: She is alert and oriented to person, place, and time.  ?   Cranial Nerves: Cranial nerve deficit: no gross deficits.  ? ?ED Results / Procedures / Treatments   ?Labs ?(all labs ordered are listed, but only abnormal results are displayed) ?Labs Reviewed - No data to display ? ?EKG ?None ? ?Radiology ?DG Knee Complete 4 Views Left ? ?Result Date: 12/17/2021 ?CLINICAL DATA:  Knee pain over the last month. EXAM: LEFT KNEE - COMPLETE 4+ VIEW COMPARISON:  None. FINDINGS: Tiny joint effusion. Medial compartment and patellofemoral compartment osteoarthritis with marginal osteophytes. No other focal finding. IMPRESSION: Tiny knee joint effusion. Medial compartment and patellofemoral compartment osteoarthritis. Electronically Signed   By: Paulina Fusi M.D.   On: 12/17/2021 13:48   ? ?Procedures ?Procedures  ? ?Medications Ordered in  ED ?Medications - No data to display ? ?ED Course/ Medical Decision Making/ A&P ?  ?                        ?Medical Decision Making ?Amount and/or Complexity of Data Reviewed ?Radiology: ordered. ? ?Pt is a 56 y.o. female who presents to the emergency department due to atraumatic left knee pain for the past month. ? ?Imaging: ?X-ray of the left knee shows a tiny knee joint effusion.  Medial compartment and patellofemoral compartment osteoarthritis. ? ?I, Placido Sou, PA-C, personally reviewed and evaluated these images and lab results as part of my medical decision-making. ? ?On my exam patient has mild tenderness diffusely along the medial and lateral joint line of the left knee as well as overlying the patella.  Full active and passive range of motion of the joint.  No soft tissue swelling or overlying skin changes.  No increased warmth in the joint.  No systemic symptoms such as fevers, chills, nausea, or vomiting.  Patient currently afebrile, nontachycardic, and nontoxic-appearing.  Doubt septic joint or gout at this time. ? 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  Her pain is typically worse at the end of the day.  History and exam findings are consistent with osteoarthritis.  I obtained an x-ray with findings as noted above. ? ?We will place patient in a knee sleeve for comfort.  Recommended continued use of Tylenol as well as Motrin.  Patient given a referral to orthopedics.  We discussed return precautions.  Her questions were answered and she was amicable at the time of discharge. ? ?Note: Portions of this report may have been transcribed using voice recognition software. Every effort was made to ensure accuracy; however, inadvertent computerized transcription errors may be present.  ? ?Final Clinical Impression(s) / ED Diagnoses ?Final diagnoses:  ?Chronic pain of left knee  ? ?Rx / DC Orders ?ED  Discharge Orders   ? ? None  ? ?  ? ? ?  ?Placido Sou, PA-C ?12/17/21 1406 ? ?  ?Benjiman Core, MD ?12/17/21 1522 ? ?

## 2021-12-17 NOTE — Discharge Instructions (Addendum)
Like we discussed, please continue to wear your knee sleeve as needed for comfort.  You can take Tylenol as well as Motrin for management of your symptoms.  Please follow instructions on the bottles. ? ?Below is the contact information for Dr. Aline Brochure.  He is a Patent examiner.  Please give them a call and schedule an appointment for reevaluation.  If you develop any new or worsening symptoms please come back to the emergency department. ?

## 2022-02-02 ENCOUNTER — Encounter: Payer: Self-pay | Admitting: Emergency Medicine

## 2022-02-02 ENCOUNTER — Ambulatory Visit
Admission: EM | Admit: 2022-02-02 | Discharge: 2022-02-02 | Disposition: A | Discharge status: Home / Self Care | Payer: 59 | Attending: Internal Medicine | Admitting: Internal Medicine

## 2022-02-02 DIAGNOSIS — J029 Acute pharyngitis, unspecified: Secondary | ICD-10-CM | POA: Insufficient documentation

## 2022-02-02 HISTORY — DX: Essential (primary) hypertension: I10

## 2022-02-02 LAB — POCT RAPID STREP A (OFFICE): Rapid Strep A Screen: NEGATIVE

## 2022-02-02 NOTE — Discharge Instructions (Addendum)
Increase oral fluid intake ?Tylenol/Motrin as needed for pain ?Strep test is negative ?We will call you with recommendations if strep cultures are abnormal ?Return to urgent care if you have any other concerns. ?

## 2022-02-02 NOTE — ED Provider Notes (Signed)
?[facility] ? ? ? ?CSN: [id number] ?Arrival date & time: 02/02/22  1302 ? ? ?  ? ?History   ?Chief Complaint ?No chief complaint on file. ? ? ?HPI ?Julie Vaughan is a 56 y.o. female comes to the urgent care with 3-day history of headaches, generalized body aches, sore throat, subjective fever and chills.  All the symptoms except sore throat have improved.  Patient continues to have sore throat with some difficulty swallowing.  No febrile episodes.  No nausea or vomiting.  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  Patient denies any shortness of breath, wheezing, chest pain or chest pressure.  No nausea, vomiting or diarrhea.  No dizziness, near syncope or syncopal episodes..  ? ?HPI ? ?Past Medical History:  ?Diagnosis Date  ? Bronchitis   ? Hypertension   ? ? ?There are no problems to display for this patient. ? ? ?History reviewed. No pertinent surgical history. ? ?OB History   ?No obstetric history on file. ?  ? ? ? ?Home Medications   ? ?Prior to Admission medications   ?Not on File  ? ? ?Family History ?History reviewed. No pertinent family history. ? ?Social History ?Social History  ? ?Tobacco Use  ? Smoking status: Never  ? Smokeless tobacco: Never  ?Substance Use Topics  ? Alcohol use: Yes  ?  Comment: occ  ? Drug use: No  ? ? ? ?Allergies   ?Patient has no known allergies. ? ? ?Review of Systems ?Review of Systems ?As per HPI ? ?Physical Exam ?Triage Vital Signs ?ED Triage Vitals  ?Enc Vitals Group  ?   BP 02/02/22 1418 115/85  ?   Pulse Rate 02/02/22 1418 (!) 112  ?   Resp 02/02/22 1418 18  ?   Temp 02/02/22 1418 99.2 ?F (37.3 ?C)  ?   Temp Source 02/02/22 1418 Oral  ?   SpO2 02/02/22 1418 94 %  ?   Weight --   ?   Height --   ?   Head Circumference --   ?   Peak Flow --   ?   Pain Score 02/02/22 1419 10  ?   Pain Loc --   ?   Pain Edu? --   ?   Excl. in [place]? --   ? ?No data found. ? ?Updated Vital Signs ?BP 115/85 (BP Location: Right Arm)    Pulse (!) 112   Temp 99.2 ?F (37.3 ?C) (Oral)   Resp 18   SpO2 94%  ? ?Visual Acuity ?Right Eye Distance:   ?Left Eye Distance:   ?Bilateral Distance:   ? ?Right Eye Near:   ?Left Eye Near:    ?Bilateral Near:    ? ?Physical Exam ?Vitals and nursing note reviewed.  ?Constitutional:   ?   General: She is not in acute distress. ?   Appearance: She is not ill-appearing.  ?HENT:  ?   Right Ear: Tympanic membrane normal.  ?   Left Ear: Tympanic membrane normal.  ?   Mouth/Throat:  ?   Mouth: Mucous membranes are moist.  ?   Pharynx: Posterior oropharyngeal erythema present.  ?Cardiovascular:  ?   Rate and Rhythm: Normal rate and regular rhythm.  ?Pulmonary:  ?   Effort: Pulmonary effort is normal.  ?   Breath sounds: Normal breath sounds.  ?Musculoskeletal:     ?   General: Swelling and tenderness present.  ?Neurological:  ?  Mental Status: She is alert.  ? ? ? ?UC Treatments / Results  ?Labs ?(all labs ordered are listed, but only abnormal results are displayed) ?Labs Reviewed  ?CULTURE, GROUP A STREP [facility])  ?POCT RAPID STREP A (OFFICE)  ? ? ?EKG ? ? ?Radiology ?No results found. ? ?Procedures ?Procedures (including critical care time) ? ?Medications Ordered in UC ?Medications - No data to display ? ?Initial Impression / Assessment and Plan / UC Course  ?I have reviewed the triage vital signs and the nursing notes. ? ?Pertinent labs & imaging results that were available during my care of the patient were reviewed by me and considered in my medical decision making (see chart for details). ? ?  ? ?1.  Acute viral illness: ?Point-of-care strep is negative ?Strep cultures have been sent ?Increase oral fluid intake ?Tylenol/Motrin as needed for pain and/or fever ?Return to urgent care if symptoms worsen. ?Final Clinical Impressions(s) / UC Diagnoses  ? ?Final diagnoses:  ?Acute viral pharyngitis  ? ? ? ?Discharge Instructions   ? ?  ?Increase oral fluid intake ?Tylenol/Motrin as needed for pain ?Strep test is  negative ?We will call you with recommendations if strep cultures are abnormal ?Return to urgent care if you have any other concerns. ? ? ?ED Prescriptions   ?None ?  ? ?PDMP not reviewed this encounter. ?  ?Chase Picket, MD ?02/02/22 1510 ? ?

## 2022-02-02 NOTE — ED Triage Notes (Signed)
Headache, chills, sore throat since Thursday.  Sore throat started on Friday.  States other symptoms have went away. Only has sore throat now. ?

## 2022-02-06 LAB — CULTURE, GROUP A STREP (THRC)

## 2022-04-30 ENCOUNTER — Emergency Department (HOSPITAL_COMMUNITY)
Admission: EM | Admit: 2022-04-30 | Discharge: 2022-04-30 | Disposition: A | Discharge status: Home / Self Care | Payer: 59 | Attending: Emergency Medicine | Admitting: Emergency Medicine

## 2022-04-30 ENCOUNTER — Emergency Department (HOSPITAL_COMMUNITY): Payer: 59

## 2022-04-30 ENCOUNTER — Encounter (HOSPITAL_COMMUNITY): Payer: Self-pay

## 2022-04-30 DIAGNOSIS — R600 Localized edema: Secondary | ICD-10-CM | POA: Insufficient documentation

## 2022-04-30 DIAGNOSIS — N3001 Acute cystitis with hematuria: Secondary | ICD-10-CM | POA: Diagnosis not present

## 2022-04-30 DIAGNOSIS — I10 Essential (primary) hypertension: Secondary | ICD-10-CM | POA: Diagnosis not present

## 2022-04-30 DIAGNOSIS — M79672 Pain in left foot: Secondary | ICD-10-CM | POA: Insufficient documentation

## 2022-04-30 DIAGNOSIS — R300 Dysuria: Secondary | ICD-10-CM | POA: Diagnosis present

## 2022-04-30 LAB — URINALYSIS, ROUTINE W REFLEX MICROSCOPIC
Bilirubin Urine: NEGATIVE
Glucose, UA: NEGATIVE mg/dL
Ketones, ur: NEGATIVE mg/dL
Nitrite: NEGATIVE
Protein, ur: NEGATIVE mg/dL
Specific Gravity, Urine: 1.02 (ref 1.005–1.030)
pH: 5 (ref 5.0–8.0)

## 2022-04-30 MED ORDER — SULFAMETHOXAZOLE-TRIMETHOPRIM 800-160 MG PO TABS: 1.0000 | ORAL_TABLET | Freq: Two times a day (BID) | ORAL | 0 refills | Status: AC

## 2022-04-30 NOTE — ED Provider Notes (Signed)
[facility] EMERGENCY DEPARTMENT Provider Note   CSN: [id number] Arrival date & time: 04/30/22  1359     History  Chief Complaint  Patient presents with   Dysuria   Foot Pain    Left    Julie Vaughan is a 56 y.o. female with a history of hypertension, presenting for evaluation of 2 complaints.  The first being increased urinary frequency along with dysuria, she states she has a history of occasional UTIs last 1 occurring within the last 12 months, she is fairly certain she has a recurrence of this condition.  She denies vaginal complaints or risk for STDs.  She denies fevers, nausea or vomiting.  She denies back or flank pain.  The second complaint is for left dorsal foot pain and swelling.  She denies injury to the foot or ankle but is on her feet a lot with her job.  She states frequently her pain is worse by the end of her work day, but over the past couple of days it has also been fairly painful when she first gets out of bed to stand on the foot in the morning.  Pain is worse with movement, better at rest.  She has used warm Epsom salt soaks which helped some.  Given her hypertension she is reluctant to take anti-inflammatories.  She denies any radiation of pain, redness, rash, numbness.  The history is provided by the patient.       Home Medications Prior to Admission medications   Medication Sig Start Date End Date Taking? Authorizing Provider  sulfamethoxazole-trimethoprim (BACTRIM DS) 800-160 MG tablet Take 1 tablet by mouth 2 (two) times daily for 5 days. 04/30/22 05/05/22 Yes Byard Carranza, Raynelle Fanning, PA-C      Allergies    Patient has no known allergies.    Review of Systems   Review of Systems  Constitutional:  Negative for chills and fever.  Genitourinary:  Positive for dysuria and frequency.  Musculoskeletal:  Positive for arthralgias and joint swelling. Negative for myalgias.  Skin: Negative.   Neurological:  Negative for weakness and numbness.  All other systems reviewed and  are negative.   Physical Exam Updated Vital Signs BP (!) 121/90 (BP Location: Right Arm)   Pulse 77   Temp 97.9 F (36.6 C) (Oral)   Resp 16   Ht 5\' 6"  (1.676 m)   Wt 115.1 kg   LMP 03/13/2020   SpO2 100%   BMI 40.95 kg/m  Physical Exam Vitals and nursing note reviewed.  Constitutional:      Appearance: She is well-developed.  HENT:     Head: Normocephalic and atraumatic.  Eyes:     Conjunctiva/sclera: Conjunctivae normal.  Cardiovascular:     Rate and Rhythm: Normal rate and regular rhythm.     Heart sounds: Normal heart sounds.  Pulmonary:     Effort: Pulmonary effort is normal.     Breath sounds: Normal breath sounds. No wheezing.  Abdominal:     General: Bowel sounds are normal.     Palpations: Abdomen is soft.     Tenderness: There is no abdominal tenderness. There is no guarding or rebound.  Musculoskeletal:        General: Normal range of motion.     Cervical back: Normal range of motion.     Left foot: Normal range of motion and normal capillary refill. Swelling and tenderness present. No deformity. Normal pulse.     Comments: She does have some mild soft tissue edema over her  left mid foot which radiates into her left lateral malleolus region.  It is modestly tender to palpation.  There is no erythema or increased warmth.  Skin is intact.  Distal sensation is intact.  Pain is worsened with active and passive flexion and extension at the ankle.  There is no foot or ankle ligament instability.  Her Achilles tendon is intact.  Skin:    General: Skin is warm and dry.     Findings: No erythema.  Neurological:     Mental Status: She is alert.    ED Results / Procedures / Treatments   Labs (all labs ordered are listed, but only abnormal results are displayed) Labs Reviewed  URINALYSIS, ROUTINE W REFLEX MICROSCOPIC - Abnormal; Notable for the following components:      Result Value   APPearance HAZY (*)    Hgb urine dipstick SMALL (*)    Leukocytes,Ua LARGE (*)     Bacteria, UA RARE (*)    All other components within normal limits    EKG None  Radiology DG Foot Complete Left  Result Date: 04/30/2022 CLINICAL DATA:  Left foot pain.  No known injury. EXAM: LEFT FOOT - COMPLETE 3+ VIEW COMPARISON:  None Available. FINDINGS: There is no evidence of fracture or dislocation. There is no evidence of arthropathy or other focal bone abnormality. Mild dorsal soft tissue swelling. IMPRESSION: 1. No acute fracture or dislocation identified about the left foot. 2. Mild dorsal soft tissue swelling. Electronically Signed   By: Ted Mcalpine M.D.   On: 04/30/2022 14:21    Procedures Procedures    Medications Ordered in ED Medications - No data to display  ED Course/ Medical Decision Making/ A&P                           Medical Decision Making Patient with 2 complaints, dysuria, concern for simple UTI, she does not have a history of kidney stones, denies flank pain fevers or chills.  Doubt this represents pyelonephritis.  No risk for STDs.  No injury to her foot but does endorse overuse, plain film imaging is negative, exam and history suggest a probable tendinitis of the foot and ankle.  She was placed in an ASO for stability and to rest the tendons.  She was encouraged to continue her warm Epsom salt soaks, she could also add an ice pack if she has increased swelling at the end of the workday.  Discussed topical Voltaren gel.  Referral to Dr. Romeo Apple if symptoms are not improved with this treatment plan.  Amount and/or Complexity of Data Reviewed Labs: ordered. Radiology: ordered.  Risk Prescription drug management.           Final Clinical Impression(s) / ED Diagnoses Final diagnoses:  Acute cystitis with hematuria  Foot pain, left    Rx / DC Orders ED Discharge Orders          Ordered    sulfamethoxazole-trimethoprim (BACTRIM DS) 800-160 MG tablet  2 times daily        04/30/22 1610              Burgess Amor,  Cordelia Poche 05/03/22 1710    Trifan, Kermit Balo, MD 05/05/22 [phone]

## 2022-04-30 NOTE — ED Notes (Signed)
Pt attempting UA at this time. 

## 2022-04-30 NOTE — ED Triage Notes (Signed)
Pt states she has a UTI. Pt endorses frequent urination and painful urination.   Pt c/o left foot pain on top of foot. Denies injury or trauma. Worse at the end of the day.

## 2022-04-30 NOTE — Discharge Instructions (Addendum)
Take the entire course of the antibiotics prescribed.  Make sure you are drinking plenty of fluids.  Get rechecked for any fever, worse pain, vomiting as these can be signs the antibiotic is not working.  Wear the ankle/foot support and continue using warm soaks (adding ice if you have increased swelling after a day at work).  I do suspect you have a tendinitis in your foot causing your symptoms.  Plan to follow up with Dr. Romeo Apple if the brace is not improving your symptoms over the next week, or your symptoms return after trying to go without the brace.

## 2022-10-22 IMAGING — DX DG KNEE COMPLETE 4+V*L*
4 series · 4 of 4 positions shown · non-contrast
Comparison: None.

CLINICAL DATA: Knee pain over the last month.

EXAM:
LEFT KNEE - COMPLETE 4+ VIEW

[knee ap]
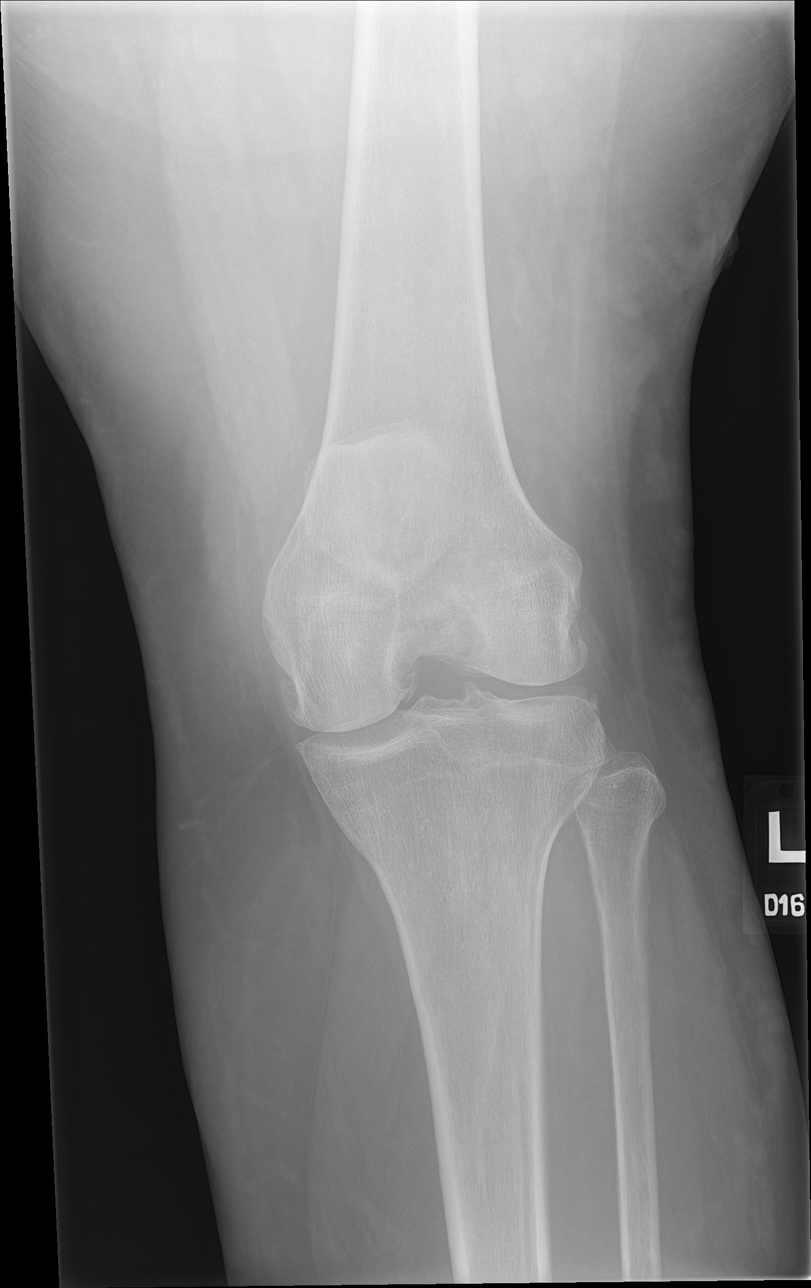

[knee obl (1 of 2)]
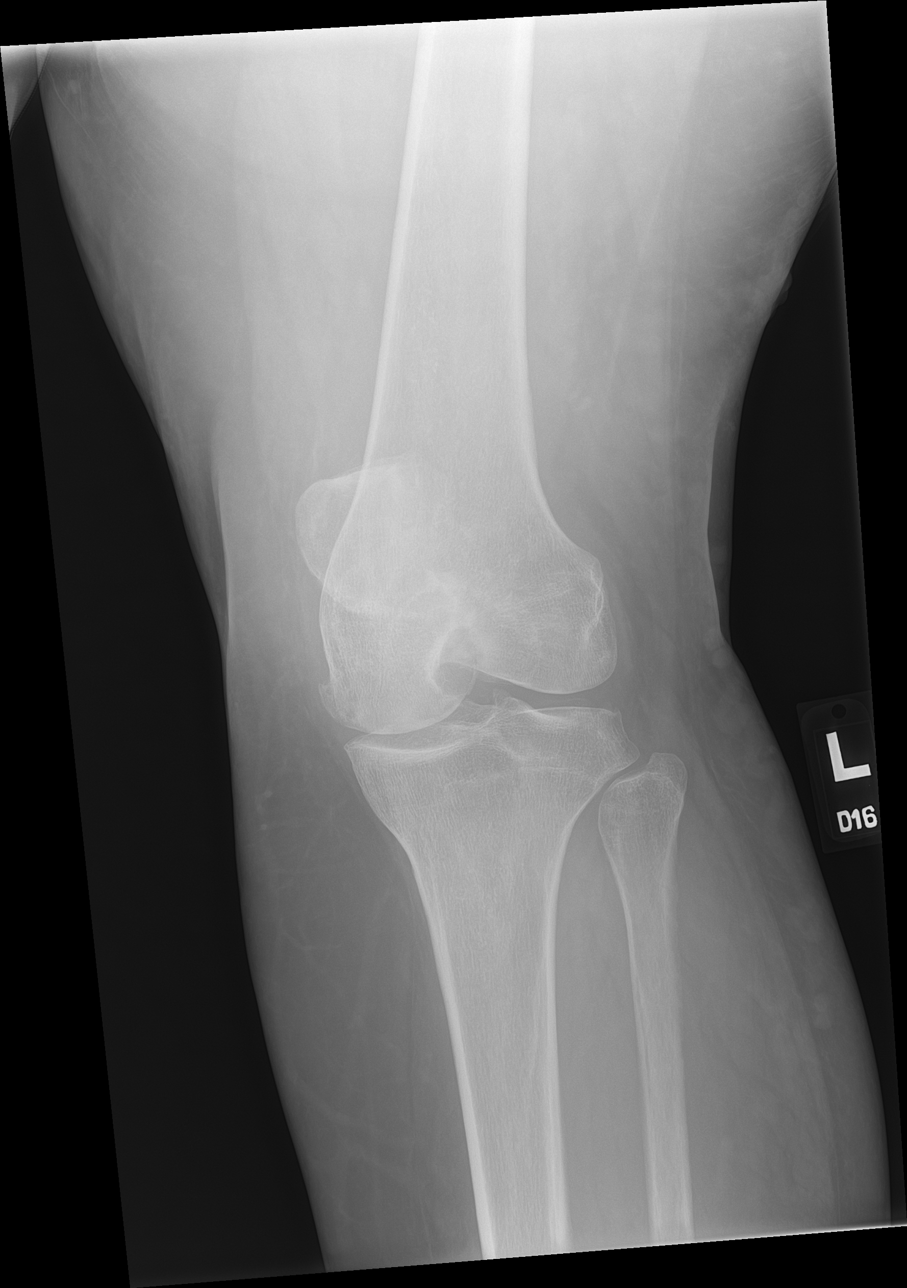

[knee obl (2 of 2)]
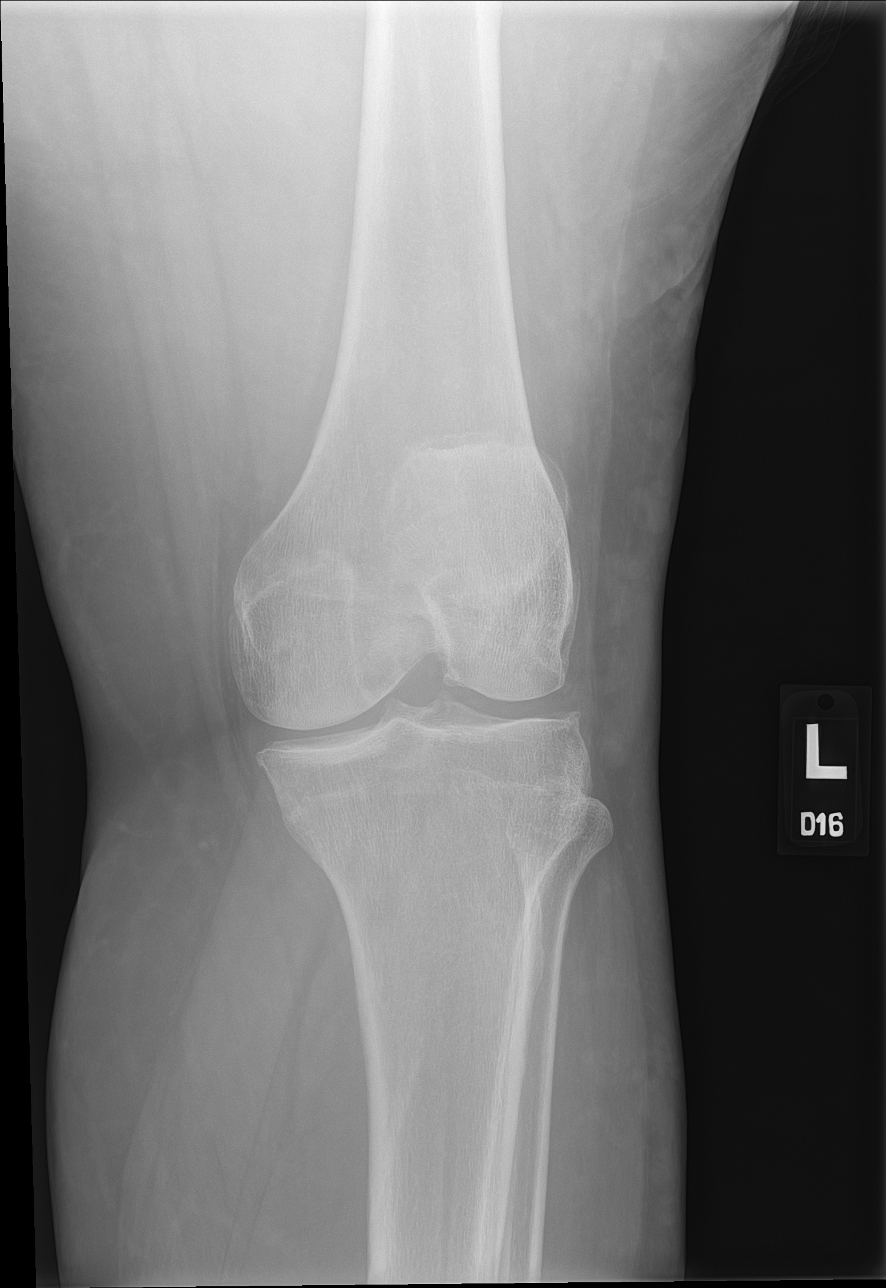

[knee lat]
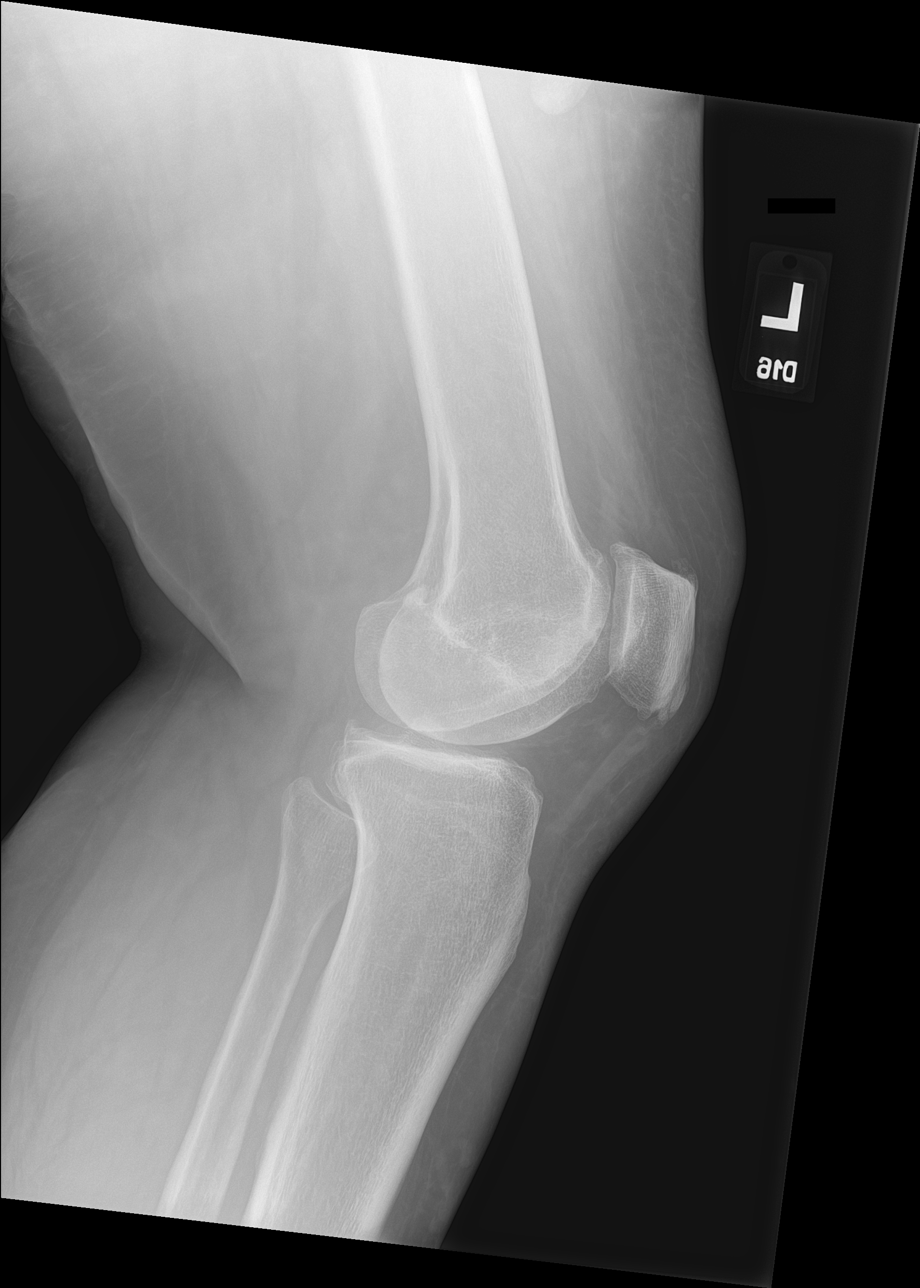

[4 of 4 positions shown; findings below may reference images not displayed]

FINDINGS: Tiny joint effusion. Medial compartment and patellofemoral
compartment osteoarthritis with marginal osteophytes. No other focal
finding.
IMPRESSION: Tiny knee joint effusion. Medial compartment and patellofemoral
compartment osteoarthritis.
